# Patient Record
Sex: Female | Born: 1970 | Race: Black or African American | Hispanic: No
Health system: Southern US, Community
[De-identification: ages and names within clinical notes are randomized; demographics above are authoritative.]

---

## 2003-10-15 ENCOUNTER — Other Ambulatory Visit: Admission: RE | Admit: 2003-10-15 | Discharge: 2003-10-15 | Payer: Self-pay | Admitting: Obstetrics and Gynecology

## 2006-10-12 ENCOUNTER — Other Ambulatory Visit: Admission: RE | Admit: 2006-10-12 | Discharge: 2006-10-12 | Payer: Self-pay | Admitting: Gynecology

## 2007-10-13 ENCOUNTER — Other Ambulatory Visit: Admission: RE | Admit: 2007-10-13 | Discharge: 2007-10-13 | Payer: Self-pay | Admitting: Gynecology

## 2008-08-20 ENCOUNTER — Other Ambulatory Visit: Admission: RE | Admit: 2008-08-20 | Discharge: 2008-08-20 | Payer: Self-pay | Admitting: Gynecology

## 2010-09-08 ENCOUNTER — Encounter: Payer: Self-pay | Admitting: Gynecology

## 2013-08-16 ENCOUNTER — Other Ambulatory Visit: Payer: Self-pay | Admitting: Physician Assistant

## 2013-08-16 DIAGNOSIS — Z1231 Encounter for screening mammogram for malignant neoplasm of breast: Secondary | ICD-10-CM

## 2013-10-09 ENCOUNTER — Ambulatory Visit
Admission: RE | Admit: 2013-10-09 | Discharge: 2013-10-09 | Disposition: A | Discharge status: Home / Self Care | Payer: BC Managed Care – PPO | Source: Ambulatory Visit | Attending: Physician Assistant | Admitting: Physician Assistant

## 2013-10-09 DIAGNOSIS — Z1231 Encounter for screening mammogram for malignant neoplasm of breast: Secondary | ICD-10-CM

## 2015-09-21 IMAGING — MG MM SCREENING BREAST TOMO BILATERAL
8 of 12 series · 8 of 28 positions shown · non-contrast
Comparison: Previous exam(s).

CLINICAL DATA: Screening.

EXAM:
DIGITAL SCREENING BILATERAL MAMMOGRAM WITH 3D TOMO WITH CAD

[R MLO]
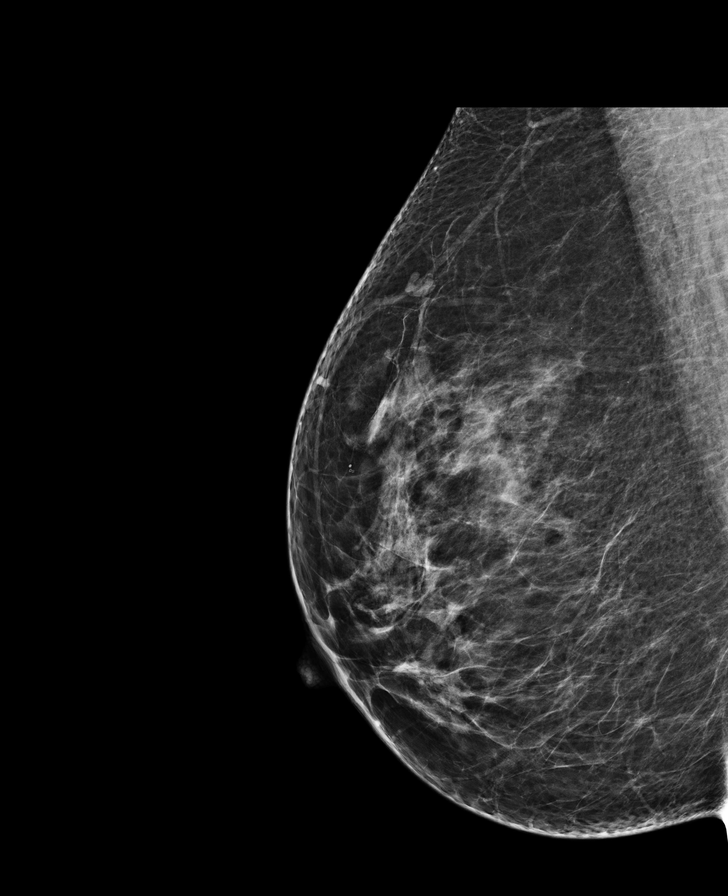

[R CC]
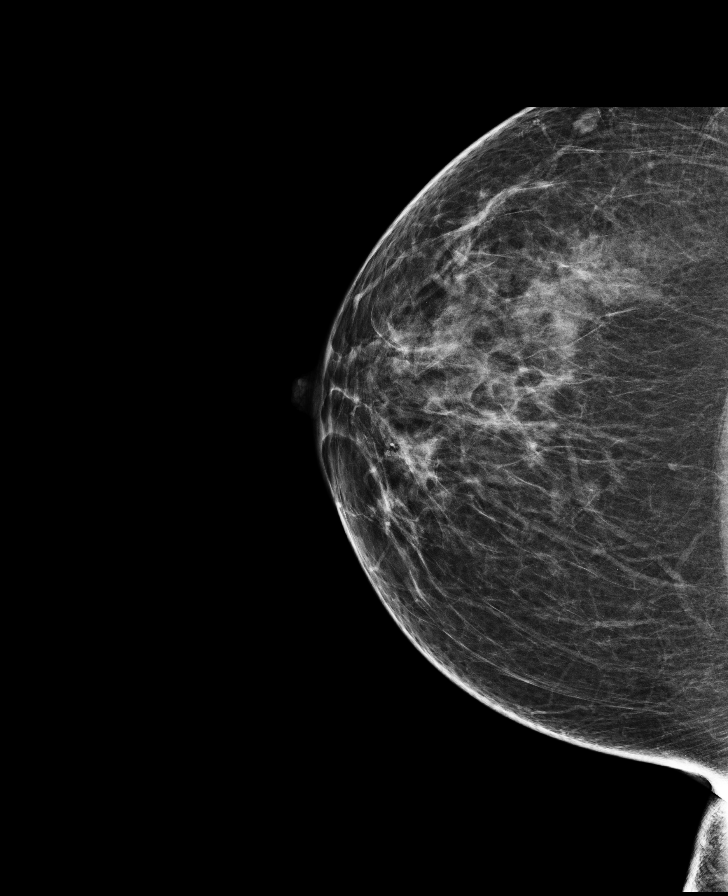

[L MLO]
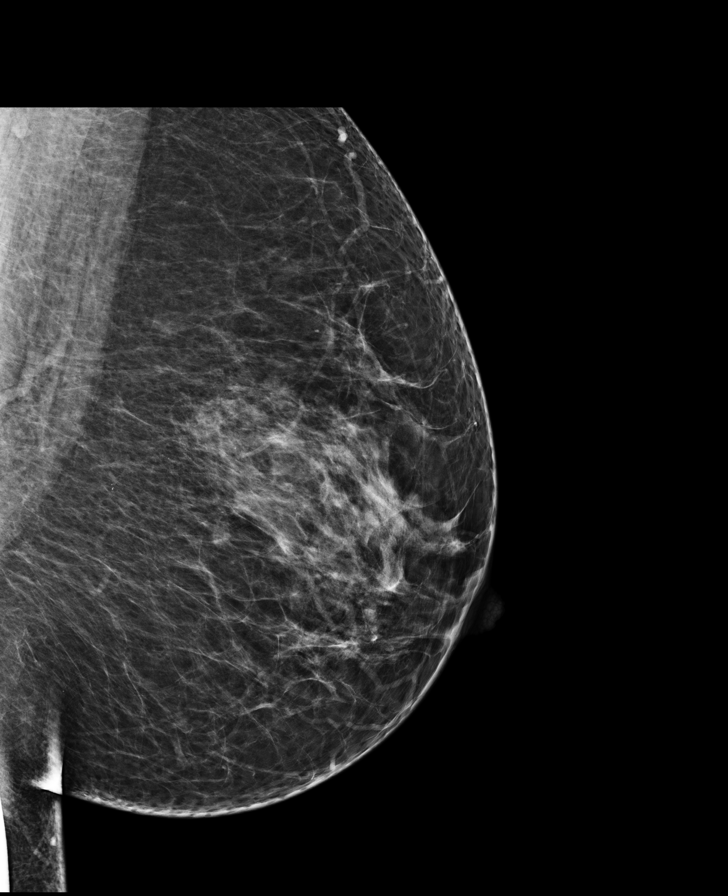

[L CC]
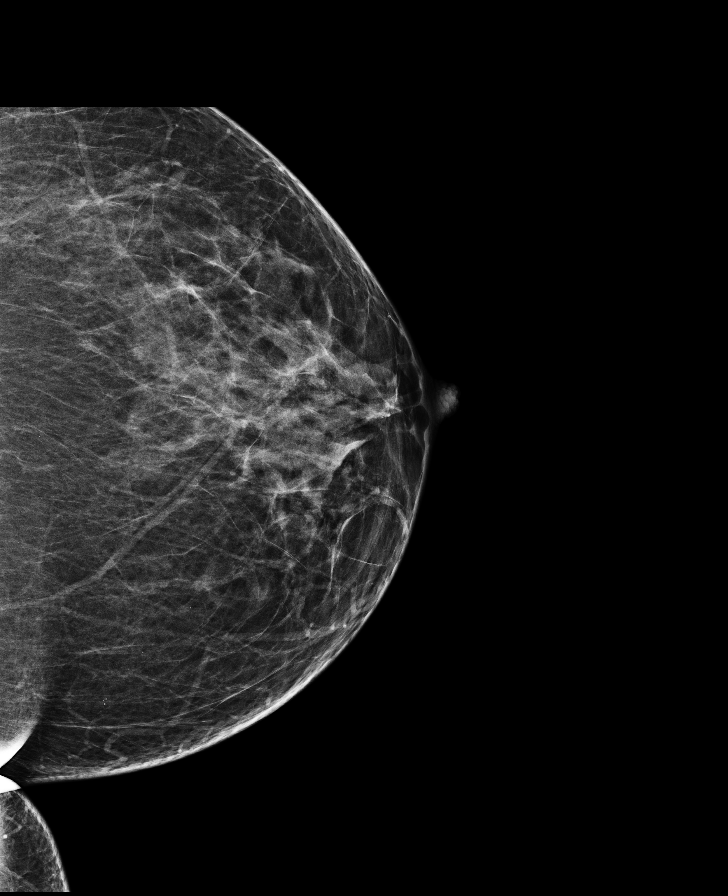

[R MLO synth-2D]
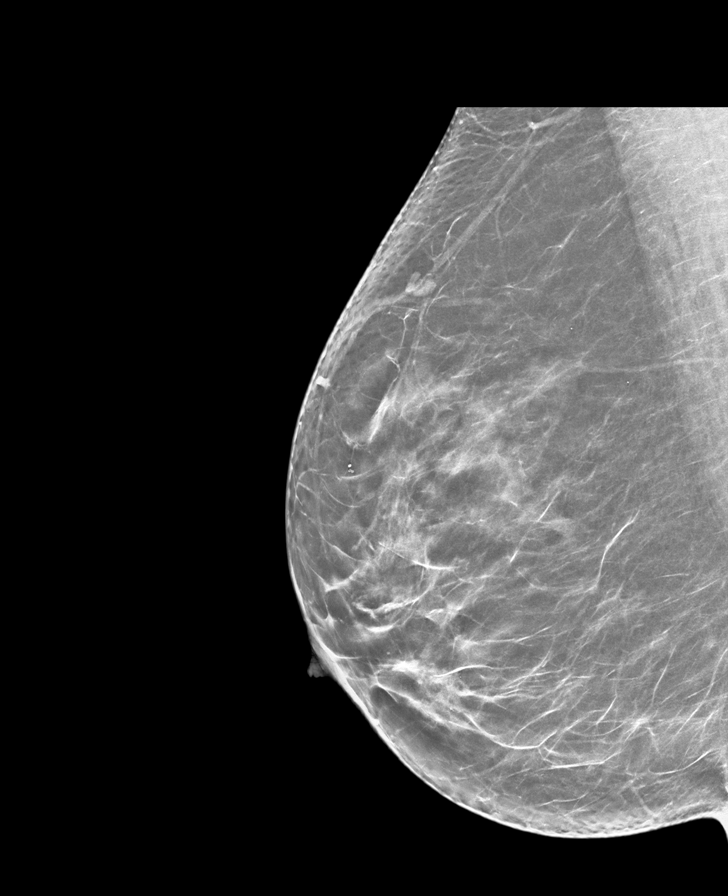

[L MLO synth-2D]
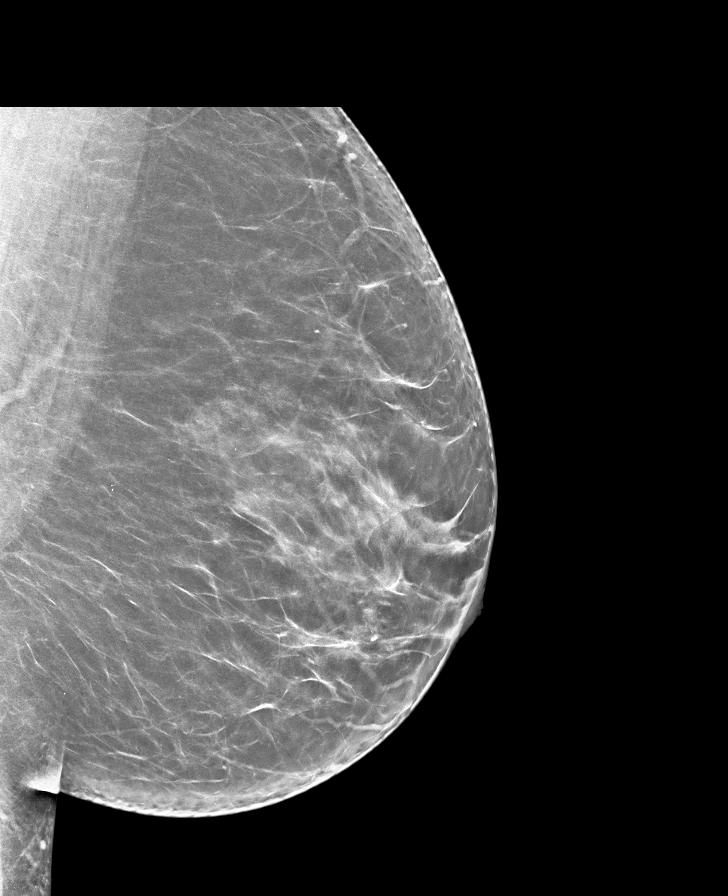

[R CC synth-2D]
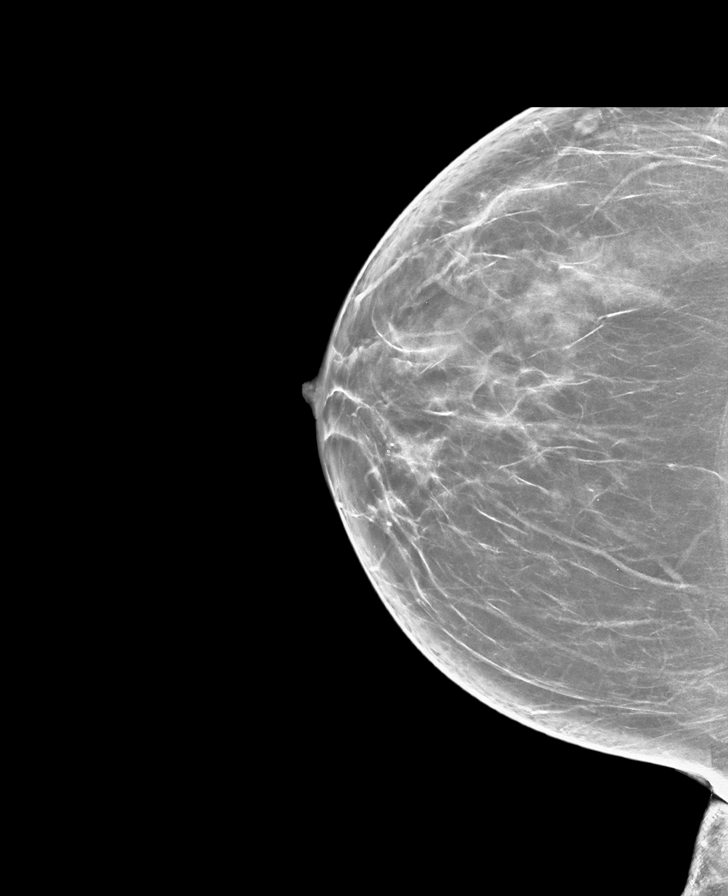

[L CC synth-2D]
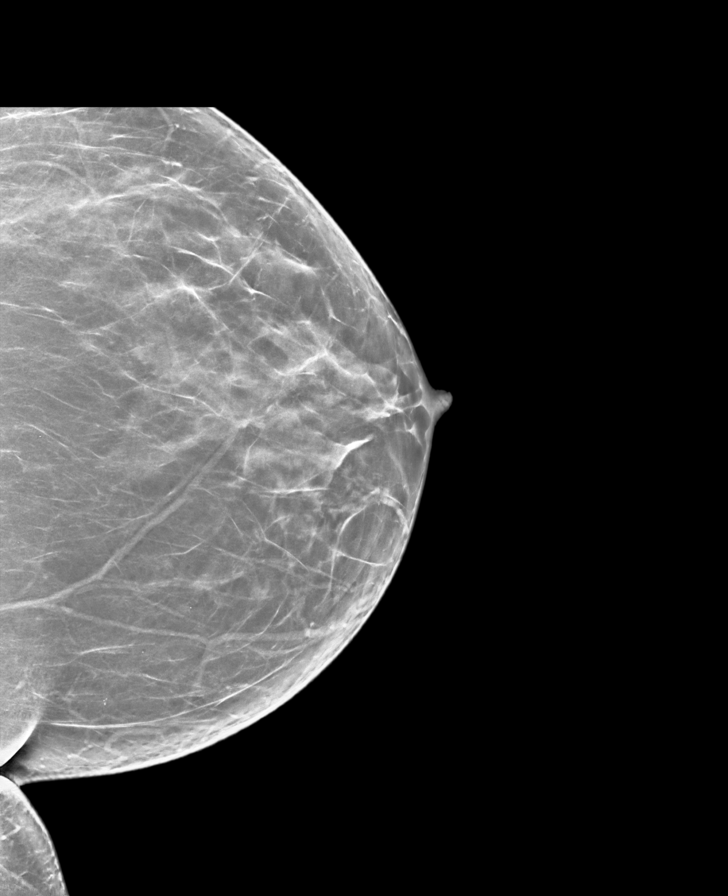

[8 of 28 positions shown; findings below may reference images not displayed]

ACR Breast Density Category c: The breast tissue is heterogeneously
dense, which may obscure small masses.
FINDINGS: There are no findings suspicious for malignancy. Images were
processed with CAD.
IMPRESSION: No mammographic evidence of malignancy. A result letter of this
screening mammogram will be mailed directly to the patient.

RECOMMENDATION:
Screening mammogram in one year. (Code:OA-G-1SS)

BI-RADS CATEGORY  1: Negative.
